# Patient Record
Sex: Female | Born: 1985 | Race: Black or African American | Hispanic: No | Marital: Married | Smoking: Current every day smoker
Health system: Southern US, Community
[De-identification: ages and names within clinical notes are randomized; demographics above are authoritative.]

## PROBLEM LIST (undated history)

## (undated) DIAGNOSIS — E282 Polycystic ovarian syndrome: Secondary | ICD-10-CM

---

## 2013-06-18 ENCOUNTER — Emergency Department (HOSPITAL_BASED_OUTPATIENT_CLINIC_OR_DEPARTMENT_OTHER)
Admission: EM | Admit: 2013-06-18 | Discharge: 2013-06-18 | Disposition: A | Discharge status: Home / Self Care | Payer: 59 | Attending: Emergency Medicine | Admitting: Emergency Medicine

## 2013-06-18 ENCOUNTER — Encounter (HOSPITAL_BASED_OUTPATIENT_CLINIC_OR_DEPARTMENT_OTHER): Payer: Self-pay | Admitting: Emergency Medicine

## 2013-06-18 DIAGNOSIS — F17200 Nicotine dependence, unspecified, uncomplicated: Secondary | ICD-10-CM | POA: Insufficient documentation

## 2013-06-18 DIAGNOSIS — K089 Disorder of teeth and supporting structures, unspecified: Secondary | ICD-10-CM | POA: Insufficient documentation

## 2013-06-18 DIAGNOSIS — K0889 Other specified disorders of teeth and supporting structures: Secondary | ICD-10-CM

## 2013-06-18 MED ORDER — HYDROCODONE-ACETAMINOPHEN 5-325 MG PO TABS: 2.0000 | ORAL_TABLET | ORAL | Status: DC | PRN

## 2013-06-18 MED ORDER — AMOXICILLIN 500 MG PO CAPS: 500.0000 mg | ORAL_CAPSULE | Freq: Three times a day (TID) | ORAL | Status: AC

## 2013-06-18 NOTE — ED Provider Notes (Signed)
Medical screening examination/treatment/procedure(s) were performed by non-physician practitioner and as supervising physician I was immediately available for consultation/collaboration.    Nelia Shiobert L Tarissa Kerin, MD 06/18/13 2029

## 2013-06-18 NOTE — ED Notes (Signed)
C/o right lower gum swelling, onset three days ago.  States 'I think I have an abscess.'

## 2013-06-18 NOTE — Discharge Instructions (Signed)

## 2013-06-18 NOTE — ED Provider Notes (Signed)
CSN: [id number]     Arrival date & time 06/18/13  1835 History   First MD Initiated Contact with Patient 06/18/13 1929     Chief Complaint  Patient presents with  . Dental Pain     (Consider location/radiation/quality/duration/timing/severity/associated sxs/prior Treatment) Patient is a 28 y.o. female presenting with tooth pain. The history is provided by the patient. No language interpreter was used.  Dental Pain Location:  Upper Upper teeth location:  15/LU 2nd molar Quality:  No pain Severity:  Moderate Duration:  3 days Timing:  Constant Progression:  Worsening Chronicity:  New Context: abscess   Relieved by:  Nothing Worsened by:  Nothing tried Ineffective treatments:  None tried   History reviewed. No pertinent past medical history. History reviewed. No pertinent past surgical history. No family history on file. History  Substance Use Topics  . Smoking status: Current Every Day Smoker  . Smokeless tobacco: Not on file  . Alcohol Use: No   OB History   Grav Para Term Preterm Abortions TAB SAB Ect Mult Living                 Review of Systems  HENT: Positive for dental problem.   All other systems reviewed and are negative.     Allergies  Review of patient's allergies indicates no known allergies.  Home Medications   Prior to Admission medications   Not on File   BP 133/92  Pulse 108  Temp(Src) 98.5 F (36.9 C) (Oral)  Resp 20  Ht 5' (1.524 m)  Wt 154 lb (69.854 kg)  BMI 30.08 kg/m2  SpO2 98% Physical Exam  Nursing note and vitals reviewed. Constitutional: She appears well-developed and well-nourished.  HENT:  Head: Normocephalic.  Swollen right lower gum   Eyes: Pupils are equal, round, and reactive to light.  Neck: Normal range of motion.  Cardiovascular: Normal rate.   Pulmonary/Chest: Effort normal.  Abdominal: Soft.    ED Course  Procedures (including critical care time) Labs Review Labs Reviewed - No data to display  Imaging  Review No results found.   EKG Interpretation None      MDM   Final diagnoses:  Toothache    amoxicillian hydrocodone    Elson AreasLeslie K Heatherly Stenner, PA-C 06/18/13 1956  Lonia SkinnerLeslie K EnsleySofia, [place]PA-C 06/18/13 [id number]

## 2014-01-27 ENCOUNTER — Emergency Department (HOSPITAL_BASED_OUTPATIENT_CLINIC_OR_DEPARTMENT_OTHER)
Admission: EM | Admit: 2014-01-27 | Discharge: 2014-01-27 | Disposition: A | Discharge status: Home / Self Care | Payer: 59 | Attending: Emergency Medicine | Admitting: Emergency Medicine

## 2014-01-27 ENCOUNTER — Encounter (HOSPITAL_BASED_OUTPATIENT_CLINIC_OR_DEPARTMENT_OTHER): Payer: Self-pay | Admitting: *Deleted

## 2014-01-27 DIAGNOSIS — N760 Acute vaginitis: Secondary | ICD-10-CM | POA: Diagnosis not present

## 2014-01-27 DIAGNOSIS — Z720 Tobacco use: Secondary | ICD-10-CM | POA: Insufficient documentation

## 2014-01-27 DIAGNOSIS — B9689 Other specified bacterial agents as the cause of diseases classified elsewhere: Secondary | ICD-10-CM

## 2014-01-27 DIAGNOSIS — Z8639 Personal history of other endocrine, nutritional and metabolic disease: Secondary | ICD-10-CM | POA: Insufficient documentation

## 2014-01-27 DIAGNOSIS — Z3202 Encounter for pregnancy test, result negative: Secondary | ICD-10-CM | POA: Diagnosis not present

## 2014-01-27 DIAGNOSIS — B3731 Acute candidiasis of vulva and vagina: Secondary | ICD-10-CM

## 2014-01-27 DIAGNOSIS — B373 Candidiasis of vulva and vagina: Secondary | ICD-10-CM | POA: Insufficient documentation

## 2014-01-27 DIAGNOSIS — R300 Dysuria: Secondary | ICD-10-CM | POA: Diagnosis present

## 2014-01-27 HISTORY — DX: Polycystic ovarian syndrome: E28.2

## 2014-01-27 LAB — WET PREP, GENITAL: Trich, Wet Prep: NONE SEEN

## 2014-01-27 LAB — URINE MICROSCOPIC-ADD ON

## 2014-01-27 LAB — URINALYSIS, ROUTINE W REFLEX MICROSCOPIC
BILIRUBIN URINE: NEGATIVE
Glucose, UA: NEGATIVE mg/dL
Ketones, ur: NEGATIVE mg/dL
NITRITE: NEGATIVE
PROTEIN: NEGATIVE mg/dL
SPECIFIC GRAVITY, URINE: 1.025 (ref 1.005–1.030)
UROBILINOGEN UA: 1 mg/dL (ref 0.0–1.0)
pH: 5 (ref 5.0–8.0)

## 2014-01-27 LAB — PREGNANCY, URINE: Preg Test, Ur: NEGATIVE

## 2014-01-27 MED ORDER — METRONIDAZOLE 500 MG PO TABS: 500.0000 mg | ORAL_TABLET | Freq: Two times a day (BID) | ORAL | Status: DC

## 2014-01-27 MED ORDER — FLUCONAZOLE 150 MG PO TABS: 150.0000 mg | ORAL_TABLET | Freq: Once | ORAL | Status: DC

## 2014-01-27 NOTE — ED Provider Notes (Signed)
CSN: [id number]     Arrival date & time 01/27/14  1042 History   First MD Initiated Contact with Patient 01/27/14 1206     Chief Complaint  Patient presents with  . Dysuria     (Consider location/radiation/quality/duration/timing/severity/associated sxs/prior Treatment) HPI Comments: Patient is a 28 year old female with a past medical history of PCOS who presents with frequent urination, genital itching and vaginal discharge for 2 weeks. The vaginal discharge is white and occurs daily. Symptoms started gradually and remained constant since the onset. No abdominal pain or fevers. No aggravating/alleviating factors. No other associated symptoms .   Past Medical History  Diagnosis Date  . PCOS (polycystic ovarian syndrome)    History reviewed. No pertinent past surgical history. No family history on file. History  Substance Use Topics  . Smoking status: Current Every Day Smoker  . Smokeless tobacco: Not on file  . Alcohol Use: No   OB History    No data available     Review of Systems  Constitutional: Negative for fever, chills and fatigue.  HENT: Negative for trouble swallowing.   Eyes: Negative for visual disturbance.  Respiratory: Negative for shortness of breath.   Cardiovascular: Negative for chest pain and palpitations.  Gastrointestinal: Negative for nausea, vomiting, abdominal pain and diarrhea.  Genitourinary: Positive for dysuria, frequency and vaginal discharge. Negative for difficulty urinating.  Musculoskeletal: Negative for arthralgias and neck pain.  Skin: Negative for color change.  Neurological: Negative for dizziness and weakness.  Psychiatric/Behavioral: Negative for dysphoric mood.      Allergies  Review of patient's allergies indicates no known allergies.  Home Medications   Prior to Admission medications   Medication Sig Start Date End Date Taking? Authorizing Provider  HYDROcodone-acetaminophen (NORCO/VICODIN) 5-325 MG per tablet Take 2 tablets  by mouth every 4 (four) hours as needed. 06/18/13   Lonia SkinnerLeslie K Sofia, PA-C   BP 122/90 mmHg  Pulse 81  Temp(Src) 98 F (36.7 C) (Oral)  Resp 16  Ht 5' (1.524 m)  Wt 150 lb (68.04 kg)  BMI 29.30 kg/m2  SpO2 97%  LMP 08/19/2013 Physical Exam  Constitutional: She is oriented to person, place, and time. She appears well-developed and well-nourished. No distress.  HENT:  Head: Normocephalic and atraumatic.  Eyes: Conjunctivae and EOM are normal.  Neck: Normal range of motion.  Cardiovascular: Normal rate and regular rhythm.  Exam reveals no gallop and no friction rub.   No murmur heard. Pulmonary/Chest: Effort normal and breath sounds normal. She has no wheezes. She has no rales. She exhibits no tenderness.  Abdominal: Soft. She exhibits no distension. There is no tenderness. There is no rebound.  Genitourinary: Vagina normal.  Copious, milky, white, frothy vaginal discharge. No CMT. Cervical os closed. No tenderness to palpation on bimanual exam.   Musculoskeletal: Normal range of motion.  Neurological: She is alert and oriented to person, place, and time. Coordination normal.  Speech is goal-oriented. Moves limbs without ataxia.   Skin: Skin is warm and dry.  Psychiatric: She has a normal mood and affect. Her behavior is normal.  Nursing note and vitals reviewed.   ED Course  Procedures (including critical care time) Labs Review Labs Reviewed  WET PREP, GENITAL - Abnormal; Notable for the following:    Yeast Wet Prep HPF POC MODERATE (*)    Clue Cells Wet Prep HPF POC TOO NUMEROUS TO COUNT (*)    WBC, Wet Prep HPF POC TOO NUMEROUS TO COUNT (*)    All  other components within normal limits  URINALYSIS, ROUTINE W REFLEX MICROSCOPIC - Abnormal; Notable for the following:    APPearance CLOUDY (*)    Hgb urine dipstick TRACE (*)    Leukocytes, UA SMALL (*)    All other components within normal limits  URINE MICROSCOPIC-ADD ON - Abnormal; Notable for the following:    Squamous  Epithelial / LPF MANY (*)    Bacteria, UA MANY (*)    All other components within normal limits  GC/CHLAMYDIA PROBE AMP  PREGNANCY, URINE    Imaging Review No results found.   EKG Interpretation None      MDM   Final diagnoses:  Vaginal yeast infection  BV (bacterial vaginosis)    12:26 PM Patient's urinalysis appears contaminated. Wet prep pending. Vitals stable and patient afebrile.   1:48 PM Patient will be treated for vaginal yeast infection and BV. Vitals stable and patient afebrile.    Emilia BeckKaitlyn Tanessa Tidd, PA-C 01/27/14 1349  Doug SouSam Jacubowitz, MD 01/27/14 1620

## 2014-01-27 NOTE — ED Notes (Signed)
Pt c/o freq urination, genital itching and vag discharge x2 weeks.

## 2014-01-27 NOTE — Discharge Instructions (Signed)
Take diflucan as directed. Take Flagyl as directed until gone. Refer to attached documents for more information. You do not have a UTI based on today's results.

## 2014-01-28 LAB — GC/CHLAMYDIA PROBE AMP
CT PROBE, AMP APTIMA: NEGATIVE
GC Probe RNA: POSITIVE — AB

## 2014-01-29 ENCOUNTER — Telehealth: Payer: Self-pay | Admitting: Emergency Medicine

## 2014-01-29 NOTE — Telephone Encounter (Signed)
+  Gonorrhea. Chart sent to EDP office for review. [facility] attached. °

## 2014-02-13 ENCOUNTER — Encounter (HOSPITAL_BASED_OUTPATIENT_CLINIC_OR_DEPARTMENT_OTHER): Payer: Self-pay

## 2014-02-13 ENCOUNTER — Emergency Department (HOSPITAL_BASED_OUTPATIENT_CLINIC_OR_DEPARTMENT_OTHER)
Admission: EM | Admit: 2014-02-13 | Discharge: 2014-02-13 | Disposition: A | Discharge status: Home / Self Care | Payer: 59 | Attending: Emergency Medicine | Admitting: Emergency Medicine

## 2014-02-13 DIAGNOSIS — Z3202 Encounter for pregnancy test, result negative: Secondary | ICD-10-CM | POA: Insufficient documentation

## 2014-02-13 DIAGNOSIS — Z720 Tobacco use: Secondary | ICD-10-CM | POA: Diagnosis not present

## 2014-02-13 DIAGNOSIS — Z8639 Personal history of other endocrine, nutritional and metabolic disease: Secondary | ICD-10-CM | POA: Insufficient documentation

## 2014-02-13 DIAGNOSIS — Z792 Long term (current) use of antibiotics: Secondary | ICD-10-CM | POA: Diagnosis not present

## 2014-02-13 DIAGNOSIS — A549 Gonococcal infection, unspecified: Secondary | ICD-10-CM | POA: Insufficient documentation

## 2014-02-13 DIAGNOSIS — N390 Urinary tract infection, site not specified: Secondary | ICD-10-CM | POA: Diagnosis not present

## 2014-02-13 DIAGNOSIS — N898 Other specified noninflammatory disorders of vagina: Secondary | ICD-10-CM | POA: Diagnosis not present

## 2014-02-13 DIAGNOSIS — Z202 Contact with and (suspected) exposure to infections with a predominantly sexual mode of transmission: Secondary | ICD-10-CM | POA: Diagnosis present

## 2014-02-13 LAB — URINE MICROSCOPIC-ADD ON

## 2014-02-13 LAB — URINALYSIS, ROUTINE W REFLEX MICROSCOPIC
Bilirubin Urine: NEGATIVE
Glucose, UA: NEGATIVE mg/dL
Ketones, ur: NEGATIVE mg/dL
Nitrite: NEGATIVE
PH: 5 (ref 5.0–8.0)
Protein, ur: NEGATIVE mg/dL
Specific Gravity, Urine: 1.021 (ref 1.005–1.030)
UROBILINOGEN UA: 0.2 mg/dL (ref 0.0–1.0)

## 2014-02-13 LAB — PREGNANCY, URINE: Preg Test, Ur: NEGATIVE

## 2014-02-13 LAB — WET PREP, GENITAL
Clue Cells Wet Prep HPF POC: NONE SEEN
Trich, Wet Prep: NONE SEEN
Yeast Wet Prep HPF POC: NONE SEEN

## 2014-02-13 MED ORDER — METRONIDAZOLE 500 MG PO TABS
2000.0000 mg | ORAL_TABLET | ORAL | Status: AC
  Administered 2014-02-13: 2000 mg via ORAL
  Filled 2014-02-13: qty 4

## 2014-02-13 MED ORDER — CEFTRIAXONE SODIUM 250 MG IJ SOLR
250.0000 mg | Freq: Once | INTRAMUSCULAR | Status: AC
  Administered 2014-02-13: 250 mg via INTRAMUSCULAR
  Filled 2014-02-13: qty 250

## 2014-02-13 MED ORDER — AZITHROMYCIN 250 MG PO TABS
1000.0000 mg | ORAL_TABLET | Freq: Once | ORAL | Status: AC
  Administered 2014-02-13: 1000 mg via ORAL
  Filled 2014-02-13: qty 4

## 2014-02-13 MED ORDER — LIDOCAINE HCL (PF) 1 % IJ SOLN
INTRAMUSCULAR | Status: AC
  Administered 2014-02-13: 5 mL
  Filled 2014-02-13: qty 5

## 2014-02-13 MED ORDER — FLUCONAZOLE 150 MG PO TABS: 150.0000 mg | ORAL_TABLET | Freq: Once | ORAL | Status: DC

## 2014-02-13 MED ORDER — CIPROFLOXACIN HCL 250 MG PO TABS: 250.0000 mg | ORAL_TABLET | Freq: Two times a day (BID) | ORAL | Status: AC

## 2014-02-13 MED ORDER — ONDANSETRON 4 MG PO TBDP
4.0000 mg | ORAL_TABLET | Freq: Once | ORAL | Status: AC
  Administered 2014-02-13: 4 mg via ORAL
  Filled 2014-02-13: qty 1

## 2014-02-13 NOTE — ED Provider Notes (Signed)
CSN: [id number]     Arrival date & time 02/13/14  [id number] History   First MD Initiated Contact with Patient 02/13/14 [id number]     Chief Complaint  Patient presents with  . Exposure to STD     (Consider location/radiation/quality/duration/timing/severity/associated sxs/prior Treatment) Patient is a 28 y.o. female presenting with STD exposure. The history is provided by the patient.  Exposure to STD This is a new problem. The current episode started more than 1 week ago. Episode frequency: once. The problem has been resolved. Pertinent negatives include no chest pain, no abdominal pain, no headaches and no shortness of breath. Nothing aggravates the symptoms. Nothing relieves the symptoms. Treatments tried: flagyl, fluconazole. The treatment provided no relief.    Past Medical History  Diagnosis Date  . PCOS (polycystic ovarian syndrome)    History reviewed. No pertinent past surgical history. No family history on file. History  Substance Use Topics  . Smoking status: Current Every Day Smoker  . Smokeless tobacco: Not on file  . Alcohol Use: No   OB History    No data available     Review of Systems  Constitutional: Negative for fever and fatigue.  HENT: Negative for congestion and drooling.   Eyes: Negative for pain.  Respiratory: Negative for cough and shortness of breath.   Cardiovascular: Negative for chest pain.  Gastrointestinal: Negative for nausea, vomiting, abdominal pain and diarrhea.  Genitourinary: Positive for dysuria and vaginal discharge. Negative for hematuria.  Musculoskeletal: Negative for back pain, gait problem and neck pain.  Skin: Negative for color change.  Neurological: Negative for dizziness and headaches.  Hematological: Negative for adenopathy.  Psychiatric/Behavioral: Negative for behavioral problems.  All other systems reviewed and are negative.     Allergies  Review of patient's allergies indicates no known allergies.  Home Medications    Prior to Admission medications   Medication Sig Start Date End Date Taking? Authorizing Provider  fluconazole (DIFLUCAN) 150 MG tablet Take 1 tablet (150 mg total) by mouth once. 01/27/14   Emilia BeckKaitlyn Szekalski, PA-C  HYDROcodone-acetaminophen (NORCO/VICODIN) 5-325 MG per tablet Take 2 tablets by mouth every 4 (four) hours as needed. 06/18/13   Elson AreasLeslie K Sofia, PA-C  metroNIDAZOLE (FLAGYL) 500 MG tablet Take 1 tablet (500 mg total) by mouth 2 (two) times daily. 01/27/14   Kaitlyn Szekalski, PA-C   BP 121/83 mmHg  Pulse 71  Temp(Src) 99.3 F (37.4 C) (Oral)  Resp 16  Ht 5' (1.524 m)  Wt 154 lb (69.854 kg)  BMI 30.08 kg/m2  SpO2 100%  LMP 08/19/2013 Physical Exam  Constitutional: She is oriented to person, place, and time. She appears well-developed and well-nourished.  HENT:  Head: Normocephalic and atraumatic.  Mouth/Throat: Oropharynx is clear and moist. No oropharyngeal exudate.  Eyes: Conjunctivae and EOM are normal. Pupils are equal, round, and reactive to light.  Neck: Normal range of motion. Neck supple.  Cardiovascular: Normal rate, regular rhythm, normal heart sounds and intact distal pulses.  Exam reveals no gallop and no friction rub.   No murmur heard. Pulmonary/Chest: Effort normal and breath sounds normal. No respiratory distress. She has no wheezes.  Abdominal: Soft. Bowel sounds are normal. There is no tenderness. There is no rebound and no guarding.  Genitourinary:  Normal-appearing external vagina. Os closed. Mild to moderate amount of milky white fluid in the posterior fornix. No cervical motion tenderness. No tenderness during the bimanual exam.  Musculoskeletal: Normal range of motion. She exhibits no edema or tenderness.  Neurological: She  is alert and oriented to person, place, and time.  Skin: Skin is warm and dry.  Psychiatric: She has a normal mood and affect. Her behavior is normal.  Nursing note and vitals reviewed.   ED Course  Procedures (including  critical care time) Labs Review Labs Reviewed  WET PREP, GENITAL - Abnormal; Notable for the following:    WBC, Wet Prep HPF POC FEW (*)    All other components within normal limits  URINALYSIS, ROUTINE W REFLEX MICROSCOPIC - Abnormal; Notable for the following:    APPearance CLOUDY (*)    Hgb urine dipstick TRACE (*)    Leukocytes, UA MODERATE (*)    All other components within normal limits  GC/CHLAMYDIA PROBE AMP  URINE CULTURE  PREGNANCY, URINE  URINE MICROSCOPIC-ADD ON    Imaging Review No results found.   EKG Interpretation None      MDM   Final diagnoses:  Gonorrhea  Vaginal discharge  UTI (lower urinary tract infection)    9:02 AM 28 y.o. female who presents with ongoing vaginal discharge and dysuria. She states that she was seen 2 weeks ago and had a pelvic exam and was treated for bacterial vaginosis and a yeast infection.it appears that she had a positive gonorrhea test. She states that she was not treated for this. She denies any fevers and has occasional pelvic pain but none currently. Afebrile and vital signs unremarkable here. Will treat empirically with antibiotics and repeat pelvic. Pt has hx of chlamydia, has had sex w/ the same partner since last visit. Last LMP was July, pt w/ hx of PCOS and irreg periods.   10:20 AM: UA suspicious for UTI. Will cover w/ cipro. Will also give fluconazole in case pt gets a yeast infection. Pt educated that her partner must be treated prior to sex again.  I have discussed the diagnosis/risks/treatment options with the patient and believe the pt to be eligible for discharge home to follow-up with her pcp as needed. We also discussed returning to the ED immediately if new or worsening sx occur. We discussed the sx which are most concerning (e.g., ongoing vag d/c or dysuria, pelvic pain, fever) that necessitate immediate return. Medications administered to the patient during their visit and any new prescriptions provided to the  patient are listed below.  Medications given during this visit Medications  cefTRIAXone (ROCEPHIN) injection 250 mg (250 mg Intramuscular Given 02/13/14 0908)  metroNIDAZOLE (FLAGYL) tablet 2,000 mg (2,000 mg Oral Given 02/13/14 0908)  azithromycin (ZITHROMAX) tablet 1,000 mg (1,000 mg Oral Given 02/13/14 0908)  lidocaine (PF) (XYLOCAINE) 1 % injection (5 mLs  Given 02/13/14 0908)  ondansetron (ZOFRAN-ODT) disintegrating tablet 4 mg (4 mg Oral Given 02/13/14 1019)    New Prescriptions   CIPROFLOXACIN (CIPRO) 250 MG TABLET    Take 1 tablet (250 mg total) by mouth 2 (two) times daily.   FLUCONAZOLE (DIFLUCAN) 150 MG TABLET    Take 1 tablet (150 mg total) by mouth once. Use as needed for symptoms of yeast infection.     Purvis SheffieldForrest Terryl Molinelli, MD 02/13/14 1024

## 2014-02-13 NOTE — ED Notes (Signed)
Reports white vaginal discharge with dysuria. Also reports that partner told her he may have urethritis.

## 2014-02-14 LAB — GC/CHLAMYDIA PROBE AMP
CT Probe RNA: NEGATIVE
GC Probe RNA: POSITIVE — AB

## 2014-02-14 LAB — URINE CULTURE: Colony Count: 30000

## 2014-02-15 ENCOUNTER — Telehealth (HOSPITAL_BASED_OUTPATIENT_CLINIC_OR_DEPARTMENT_OTHER): Payer: Self-pay | Admitting: Emergency Medicine

## 2014-02-17 ENCOUNTER — Telehealth (HOSPITAL_COMMUNITY): Payer: Self-pay | Admitting: *Deleted

## 2016-03-11 ENCOUNTER — Encounter (HOSPITAL_BASED_OUTPATIENT_CLINIC_OR_DEPARTMENT_OTHER): Payer: Self-pay | Admitting: Emergency Medicine

## 2016-03-11 ENCOUNTER — Emergency Department (HOSPITAL_BASED_OUTPATIENT_CLINIC_OR_DEPARTMENT_OTHER)
Admission: EM | Admit: 2016-03-11 | Discharge: 2016-03-11 | Disposition: A | Discharge status: Home / Self Care | Payer: 59 | Attending: Emergency Medicine | Admitting: Emergency Medicine

## 2016-03-11 DIAGNOSIS — F17200 Nicotine dependence, unspecified, uncomplicated: Secondary | ICD-10-CM | POA: Diagnosis not present

## 2016-03-11 DIAGNOSIS — N939 Abnormal uterine and vaginal bleeding, unspecified: Secondary | ICD-10-CM

## 2016-03-11 LAB — CBC
HCT: 41 % (ref 36.0–46.0)
Hemoglobin: 13.8 g/dL (ref 12.0–15.0)
MCH: 29.8 pg (ref 26.0–34.0)
MCHC: 33.7 g/dL (ref 30.0–36.0)
MCV: 88.6 fL (ref 78.0–100.0)
PLATELETS: 372 10*3/uL (ref 150–400)
RBC: 4.63 MIL/uL (ref 3.87–5.11)
RDW: 12.6 % (ref 11.5–15.5)
WBC: 7.5 10*3/uL (ref 4.0–10.5)

## 2016-03-11 NOTE — ED Provider Notes (Signed)
[facility]   CSN: [id number] Arrival date & time: 03/11/16  1024     History   Chief Complaint Chief Complaint  Patient presents with  . Menorrhagia    HPI Sydney Harmon is a 31 y.o. female.  The history is provided by the patient and medical records. No language interpreter was used.    Sydney Harmon is a 31 y.o. female  with a PMH of PCOS who presents to the Emergency Department complaining of prolonged vaginal bleeding. Patient states that she had a menstrual cycle at the end of December which lasted approximately 3 weeks. She saw her OB/GYN at that time he started patient on a new medication (she believes this is progestin, but not positive). Symptoms did not improve, therefore she called the clinic and they informed her to take 4 pills instead of 2 daily. Bleeding stopped for 4 days, but then returned last week. She has had heavy bleeding for the last 6 days. She is very concerned today because she started passing clots, greater than 5 times per day. He shouldn't notes that clots were between a quarter size or golfball size. She has been going through a pad every hour until yesterday when she started going through a pad every 2-3 hours. This is happened one time before, several years ago when on OCPs. No lightheadedness, dizziness, fatigue or syncopal episodes. No abdominal pain, nausea or vomiting.    Past Medical History:  Diagnosis Date  . PCOS (polycystic ovarian syndrome)     There are no active problems to display for this patient.   No past surgical history on file.  OB History    No data available       Home Medications    Prior to Admission medications   Not on File    Family History No family history on file.  Social History Social History  Substance Use Topics  . Smoking status: Current Every Day Smoker  . Smokeless tobacco: Never Used  . Alcohol use No     Allergies   Patient has no known  allergies.   Review of Systems Review of Systems  Constitutional: Negative for fatigue.  HENT: Negative for congestion.   Eyes: Negative for visual disturbance.  Respiratory: Negative for shortness of breath.   Cardiovascular: Negative for chest pain.  Gastrointestinal: Negative for abdominal pain, constipation, diarrhea, nausea and vomiting.  Genitourinary: Positive for vaginal bleeding. Negative for dysuria.  Musculoskeletal: Negative for back pain.  Skin: Negative for rash.  Neurological: Negative for weakness and light-headedness.     Physical Exam Updated Vital Signs BP 134/97 (BP Location: Left Arm)   Pulse 76   Temp 98.6 F (37 C) (Oral)   Resp 16   Ht 5' (1.524 m)   Wt 74.8 kg   LMP 03/04/2016   SpO2 100%   BMI 32.22 kg/m   Physical Exam  Constitutional: She is oriented to person, place, and time. She appears well-developed and well-nourished. No distress.  HENT:  Head: Normocephalic and atraumatic.  Cardiovascular: Normal rate, regular rhythm and normal heart sounds.   No murmur heard. Pulmonary/Chest: Effort normal and breath sounds normal. No respiratory distress.  Abdominal: Soft. She exhibits no distension. There is no tenderness.  Genitourinary:  Genitourinary Comments: Deferred.  Neurological: She is alert and oriented to person, place, and time.  Skin: Skin is warm and dry. Capillary refill takes less than 2 seconds.  Psychiatric: She has a normal mood and affect.  Nursing  note and vitals reviewed.    ED Treatments / Results  Labs (all labs ordered are listed, but only abnormal results are displayed) Labs Reviewed  CBC    EKG  EKG Interpretation None       Radiology No results found.  Procedures Procedures (including critical care time)  Medications Ordered in ED Medications - No data to display   Initial Impression / Assessment and Plan / ED Course  I have reviewed the triage vital signs and the nursing notes.  Pertinent  labs & imaging results that were available during my care of the patient were reviewed by me and considered in my medical decision making (see chart for details).    Abrar Maggard is a 31 y.o. female who presents to ED for persistent vaginal bleeding. Patient has seen OB/GYN for this and then placed on medication which she believes is progestin. CBC with normal H&H and patient experiencing no symptoms of anemia. The patient is followed by OB/GYN who is managing this problem with medication, therefore I do not feel that it is in the patient's best interest to change medication regimen at this time. I discussed this with patient and family at bedside. I called her OB/GYN and scheduled her an appointment for tomorrow afternoon at 1:15. She is aware of this appointment and agrees to follow-up. Given that she has such close follow-up with OB/GYN, will defer pelvic at this time. Patient discharged in satisfactory condition and all questions answered.  Final Clinical Impressions(s) / ED Diagnoses   Final diagnoses:  None    New Prescriptions New Prescriptions   No medications on file     [facility] Fredrick Geoghegan, PA-C 03/11/16 1445    Alvira Monday, MD 03/13/16 [phone]

## 2016-03-11 NOTE — ED Notes (Signed)
ED Provider at bedside. 

## 2016-03-11 NOTE — ED Notes (Signed)
States she has always had irregular periods with increasing frequency since November. Her OBGYN has started her on provera on Jan 5

## 2016-03-11 NOTE — Discharge Instructions (Signed)
You have an appointment with the OB/GYN, Dr. Rito Ehrlich'Keefe, tomorrow at 1:15 PM.   [facility]   [address]   [address]   [place] [id number]   [phone]      Please keep this scheduled appointment. Return to ER for new or worsening symptoms, any additional concerns.

## 2016-03-11 NOTE — ED Triage Notes (Signed)
Pt having abnormal periods since 12 but this months pt is having severe prolonged bleeding.  Pt on hormone treatment but has bleed for over one week with heavy bleeding, clots and generalized malaise.

## 2020-12-19 ENCOUNTER — Emergency Department (HOSPITAL_BASED_OUTPATIENT_CLINIC_OR_DEPARTMENT_OTHER)
Admission: EM | Admit: 2020-12-19 | Discharge: 2020-12-19 | Disposition: A | Discharge status: Home / Self Care | Payer: BC Managed Care – PPO | Attending: Emergency Medicine | Admitting: Emergency Medicine

## 2020-12-19 ENCOUNTER — Emergency Department (HOSPITAL_BASED_OUTPATIENT_CLINIC_OR_DEPARTMENT_OTHER): Payer: BC Managed Care – PPO

## 2020-12-19 ENCOUNTER — Other Ambulatory Visit: Payer: Self-pay

## 2020-12-19 ENCOUNTER — Encounter (HOSPITAL_BASED_OUTPATIENT_CLINIC_OR_DEPARTMENT_OTHER): Payer: Self-pay | Admitting: Radiology

## 2020-12-19 DIAGNOSIS — R1031 Right lower quadrant pain: Secondary | ICD-10-CM | POA: Diagnosis present

## 2020-12-19 DIAGNOSIS — N8301 Follicular cyst of right ovary: Secondary | ICD-10-CM | POA: Insufficient documentation

## 2020-12-19 DIAGNOSIS — R110 Nausea: Secondary | ICD-10-CM | POA: Insufficient documentation

## 2020-12-19 DIAGNOSIS — N83201 Unspecified ovarian cyst, right side: Secondary | ICD-10-CM

## 2020-12-19 DIAGNOSIS — K5900 Constipation, unspecified: Secondary | ICD-10-CM | POA: Insufficient documentation

## 2020-12-19 DIAGNOSIS — F17200 Nicotine dependence, unspecified, uncomplicated: Secondary | ICD-10-CM | POA: Insufficient documentation

## 2020-12-19 LAB — CBC WITH DIFFERENTIAL/PLATELET
Abs Immature Granulocytes: 0.02 10*3/uL (ref 0.00–0.07)
Basophils Absolute: 0 10*3/uL (ref 0.0–0.1)
Basophils Relative: 0 %
Eosinophils Absolute: 0.2 10*3/uL (ref 0.0–0.5)
Eosinophils Relative: 3 %
HCT: 45.8 % (ref 36.0–46.0)
Hemoglobin: 15 g/dL (ref 12.0–15.0)
Immature Granulocytes: 0 %
Lymphocytes Relative: 24 %
Lymphs Abs: 1.7 10*3/uL (ref 0.7–4.0)
MCH: 29.4 pg (ref 26.0–34.0)
MCHC: 32.8 g/dL (ref 30.0–36.0)
MCV: 89.6 fL (ref 80.0–100.0)
Monocytes Absolute: 0.4 10*3/uL (ref 0.1–1.0)
Monocytes Relative: 6 %
Neutro Abs: 4.7 10*3/uL (ref 1.7–7.7)
Neutrophils Relative %: 67 %
Platelets: 269 10*3/uL (ref 150–400)
RBC: 5.11 MIL/uL (ref 3.87–5.11)
RDW: 12.3 % (ref 11.5–15.5)
WBC: 7.1 10*3/uL (ref 4.0–10.5)
nRBC: 0 % (ref 0.0–0.2)

## 2020-12-19 LAB — LACTIC ACID, PLASMA: Lactic Acid, Venous: 0.9 mmol/L (ref 0.5–1.9)

## 2020-12-19 LAB — URINALYSIS, MICROSCOPIC (REFLEX)

## 2020-12-19 LAB — URINALYSIS, ROUTINE W REFLEX MICROSCOPIC
Bilirubin Urine: NEGATIVE
Glucose, UA: NEGATIVE mg/dL
Ketones, ur: NEGATIVE mg/dL
Leukocytes,Ua: NEGATIVE
Nitrite: NEGATIVE
Protein, ur: NEGATIVE mg/dL
Specific Gravity, Urine: 1.01 (ref 1.005–1.030)
pH: 5.5 (ref 5.0–8.0)

## 2020-12-19 LAB — PREGNANCY, URINE: Preg Test, Ur: NEGATIVE

## 2020-12-19 LAB — COMPREHENSIVE METABOLIC PANEL
ALT: 39 U/L (ref 0–44)
AST: 28 U/L (ref 15–41)
Albumin: 4.8 g/dL (ref 3.5–5.0)
Alkaline Phosphatase: 69 U/L (ref 38–126)
Anion gap: 8 (ref 5–15)
BUN: 7 mg/dL (ref 6–20)
CO2: 25 mmol/L (ref 22–32)
Calcium: 9.2 mg/dL (ref 8.9–10.3)
Chloride: 101 mmol/L (ref 98–111)
Creatinine, Ser: 0.69 mg/dL (ref 0.44–1.00)
GFR, Estimated: >60 mL/min (ref >60)
Glucose, Bld: 97 mg/dL (ref 70–99)
Potassium: 4.3 mmol/L (ref 3.5–5.1)
Sodium: 134 mmol/L — ABNORMAL LOW (ref 135–145)
Total Bilirubin: 1 mg/dL (ref 0.3–1.2)
Total Protein: 8.1 g/dL (ref 6.5–8.1)

## 2020-12-19 LAB — LIPASE, BLOOD: Lipase: 26 U/L (ref 11–51)

## 2020-12-19 MED ORDER — ONDANSETRON HCL 4 MG PO TABS: 4.0000 mg | ORAL_TABLET | Freq: Three times a day (TID) | ORAL | 0 refills | Status: AC | PRN

## 2020-12-19 MED ORDER — MORPHINE SULFATE (PF) 4 MG/ML IV SOLN
4.0000 mg | Freq: Once | INTRAVENOUS | Status: AC
  Administered 2020-12-19: 4 mg via INTRAVENOUS
  Filled 2020-12-19: qty 1

## 2020-12-19 MED ORDER — OXYCODONE-ACETAMINOPHEN 5-325 MG PO TABS: 1.0000 | ORAL_TABLET | ORAL | 0 refills | Status: AC | PRN

## 2020-12-19 MED ORDER — ONDANSETRON HCL 4 MG/2ML IJ SOLN
4.0000 mg | Freq: Once | INTRAMUSCULAR | Status: AC
  Administered 2020-12-19: 4 mg via INTRAVENOUS
  Filled 2020-12-19: qty 2

## 2020-12-19 MED ORDER — IOHEXOL 300 MG/ML  SOLN
100.0000 mL | Freq: Once | INTRAMUSCULAR | Status: AC | PRN
  Administered 2020-12-19: 100 mL via INTRAVENOUS

## 2020-12-19 NOTE — ED Notes (Signed)
Pt states she wants to leave, explained to her all results were in and ED provider was made aware

## 2020-12-19 NOTE — Discharge Instructions (Signed)
Your history, exam, work-up today are consistent with a hemorrhagic right ovarian cyst causing your discomfort.  There was no evidence of appendicitis or diverticulitis on the CT scan and the ultrasound showed the likely hemorrhagic cyst causing your symptoms.  Other work-up was overall reassuring and given your slight improvement in pain, we feel you are safe for discharge home.  Please use the pain medicine nausea symptomatic hydration and help with the discomfort and please follow-up with your OB/GYN for further management.  If any symptoms change or worsen acutely, please return to the nearest emergency department.

## 2020-12-19 NOTE — ED Notes (Signed)
ED Provider at bedside. 

## 2020-12-19 NOTE — ED Provider Notes (Signed)
[facility] EMERGENCY DEPARTMENT Provider Note   CSN: [id number] Arrival date & time: 12/19/20  0630     History Chief Complaint  Patient presents with   Abdominal Pain    Sydney Harmon is a 35 y.o. female.  The history is provided by the patient and medical records. No language interpreter was used.  Abdominal Pain Pain location:  RLQ Pain quality: aching and sharp   Pain radiates to:  Does not radiate Pain severity:  Severe Onset quality:  Gradual Duration:  3 days Timing:  Constant Progression:  Worsening Chronicity:  New Relieved by:  Nothing Worsened by:  Nothing Ineffective treatments:  None tried Associated symptoms: constipation and nausea   Associated symptoms: no chest pain, no chills, no cough, no diarrhea, no dysuria, no fatigue, no fever, no shortness of breath and no vomiting   Risk factors: not pregnant       Past Medical History:  Diagnosis Date   PCOS (polycystic ovarian syndrome)     There are no problems to display for this patient.   No past surgical history on file.   OB History   No obstetric history on file.     No family history on file.  Social History   Tobacco Use   Smoking status: Every Day   Smokeless tobacco: Never  Substance Use Topics   Alcohol use: No   Drug use: No    Home Medications Prior to Admission medications   Not on File    Allergies    Patient has no known allergies.  Review of Systems   Review of Systems  Constitutional:  Negative for chills, fatigue and fever.  HENT:  Negative for congestion.   Respiratory:  Negative for cough, chest tightness, shortness of breath and wheezing.   Cardiovascular:  Negative for chest pain.  Gastrointestinal:  Positive for abdominal pain, constipation and nausea. Negative for abdominal distention, diarrhea and vomiting.  Genitourinary:  Negative for dysuria, flank pain and frequency.  Musculoskeletal:  Negative for back pain, neck pain and neck  stiffness.  Skin:  Negative for wound.  Neurological:  Negative for light-headedness and headaches.  Psychiatric/Behavioral:  Negative for agitation.    Physical Exam Updated Vital Signs BP (!) 144/109   Pulse 80   Temp 98.1 F (36.7 C) (Oral)   Resp 18   Ht 5' (1.524 m)   Wt 75.3 kg   LMP 11/20/2020 (Approximate) Comment: hx of PCOs  SpO2 100%   BMI 32.42 kg/m   Physical Exam Vitals and nursing note reviewed.  Constitutional:      General: She is not in acute distress.    Appearance: She is well-developed. She is not ill-appearing, toxic-appearing or diaphoretic.  HENT:     Head: Normocephalic and atraumatic.     Mouth/Throat:     Mouth: Mucous membranes are moist.  Eyes:     Conjunctiva/sclera: Conjunctivae normal.  Cardiovascular:     Rate and Rhythm: Normal rate and regular rhythm.     Heart sounds: No murmur heard. Pulmonary:     Effort: Pulmonary effort is normal. No respiratory distress.     Breath sounds: Normal breath sounds. No wheezing, rhonchi or rales.  Chest:     Chest wall: No tenderness.  Abdominal:     General: Bowel sounds are normal.     Palpations: Abdomen is soft.     Tenderness: There is abdominal tenderness in the right lower quadrant. There is no right CVA tenderness, left CVA  tenderness or guarding.  Musculoskeletal:     Cervical back: Neck supple.  Skin:    General: Skin is warm and dry.     Capillary Refill: Capillary refill takes less than 2 seconds.  Neurological:     Mental Status: She is alert.  Psychiatric:        Mood and Affect: Mood normal.    ED Results / Procedures / Treatments   Labs (all labs ordered are listed, but only abnormal results are displayed) Labs Reviewed  URINALYSIS, ROUTINE W REFLEX MICROSCOPIC - Abnormal; Notable for the following components:      Result Value   Color, Urine STRAW (*)    Hgb urine dipstick TRACE (*)    All other components within normal limits  URINALYSIS, MICROSCOPIC (REFLEX) -  Abnormal; Notable for the following components:   Bacteria, UA RARE (*)    All other components within normal limits  COMPREHENSIVE METABOLIC PANEL - Abnormal; Notable for the following components:   Sodium 134 (*)    All other components within normal limits  PREGNANCY, URINE  CBC WITH DIFFERENTIAL/PLATELET  LIPASE, BLOOD  LACTIC ACID, PLASMA    EKG None  Radiology CT ABDOMEN PELVIS W CONTRAST  Result Date: 12/19/2020 CLINICAL DATA:  Right lower quadrant abdominal pain. Clinical concern for appendicitis. EXAM: CT ABDOMEN AND PELVIS WITH CONTRAST TECHNIQUE: Multidetector CT imaging of the abdomen and pelvis was performed using the standard protocol following bolus administration of intravenous contrast. CONTRAST:  OMNIPAQUE IOHEXOL 300 MG/ML  SOLN COMPARISON:  None. FINDINGS: Lower chest: Clear lung bases.  Heart normal in size. Hepatobiliary: Decreased attenuation of the liver consistent with fatty infiltration. Liver normal in size. No masses. Normal gallbladder. No bile duct dilation. Pancreas: Unremarkable. No pancreatic ductal dilatation or surrounding inflammatory changes. Spleen: Normal in size without focal abnormality. Adrenals/Urinary Tract: Adrenal glands are unremarkable. Kidneys are normal, without renal calculi, focal lesion, or hydronephrosis. Bladder is unremarkable. Stomach/Bowel: Normal appendix visualized. Normal stomach. Small bowel and colon are normal in caliber. No wall thickening or inflammation. Vascular/Lymphatic: No significant vascular findings are present. No enlarged abdominal or pelvic lymph nodes. Reproductive: Normal uterus. Right adnexal cystic mass measuring 5.4 x 4.1 x 4.7 cm. This is likely an ovarian cyst. No left adnexal masses. Other: No abdominal wall hernia or abnormality. No abdominopelvic ascites. Musculoskeletal: No acute or significant osseous findings. IMPRESSION: 1. 5.4 cm right adnexal/ovarian cystic mass. Given the patient's symptoms of right  lower quadrant abdominal pain, further evaluation of this finding with transabdominal and endovaginal pelvic ultrasound with Doppler analysis is recommended to exclude torsion and further characterize the lesion. 2. Normal appendix visualized. 3. Hepatic steatosis.  No other abnormalities. Electronically Signed   By: Amie Portland M.D.   On: 12/19/2020 08:57   US PELVIC COMPLETE W TRANSVAGINAL AND TORSION R/O  Result Date: 12/19/2020 CLINICAL DATA:  Right lower quadrant pain.  Follow-up CT EXAM: TRANSABDOMINAL AND TRANSVAGINAL ULTRASOUND OF PELVIS DOPPLER ULTRASOUND OF OVARIES TECHNIQUE: Both transabdominal and transvaginal ultrasound examinations of the pelvis were performed. Transabdominal technique was performed for global imaging of the pelvis including uterus, ovaries, adnexal regions, and pelvic cul-de-sac. It was necessary to proceed with endovaginal exam following the transabdominal exam to visualize the uterus, endometrium, ovaries and adnexa. Color and duplex Doppler ultrasound was utilized to evaluate blood flow to the ovaries. COMPARISON:  CT earlier today FINDINGS: Uterus Measurements: 8.1 x 4.6 x 4.5 cm = volume: 88 mL. No fibroids or other mass visualized. Endometrium Thickness:  18 mm in thickness.  No focal abnormality visualized. Right ovary Measurements: 5.2 x 5.2 x 4.4 cm = volume: 63 mL. 4 cm complex lesion in the right ovary with internal echoes. Appearance is most compatible with endometrioma or hemorrhagic cyst. Left ovary Measurements: 9 x 3.5 x 2.1 cm = volume: 19 mL. Normal appearance/no adnexal mass. Pulsed Doppler evaluation of both ovaries demonstrates normal low-resistance arterial and venous waveforms. Other findings No abnormal free fluid. IMPRESSION: Complex lesion in the right ovary measuring up to 4 cm most compatible with endometrioma or hemorrhagic cyst. No evidence of torsion. This could be followed with repeat ultrasound in 3-6 months. Electronically Signed   By: Charlett Nose M.D.   On: 12/19/2020 11:25    Procedures Procedures   Medications Ordered in ED Medications  ondansetron (ZOFRAN) injection 4 mg (4 mg Intravenous Given 12/19/20 0904)  morphine 4 MG/ML injection 4 mg (4 mg Intravenous Given 12/19/20 0904)  iohexol (OMNIPAQUE) 300 MG/ML solution 100 mL (100 mLs Intravenous Contrast Given 12/19/20 0818)    ED Course  I have reviewed the triage vital signs and the nursing notes.  Pertinent labs & imaging results that were available during my care of the patient were reviewed by me and considered in my medical decision making (see chart for details).    MDM Rules/Calculators/A&P                           Nil Whistler is a 35 y.o. female with a past medical history significant for PCOS who presents with right lower quadrant abdominal pain.  Patient reports that 3 days ago, patient began near her umbilicus and then has migrated to her right lower quadrant.  She reports it is 10 out of 10 now and she has some nausea no vomiting.  She reports no recent trauma and denies any vaginal discharge or bleeding.  She denies history of kidney stones but still has her appendix.  She reports no rashes to suggest shingles and reports the pain is not improving.  She reports some constipation but denies diarrhea.  No other fevers, chills, chest pain or shortness of breath, or cough.  On exam, right lower quadrant is tender to palpation.  Bowel sounds were appreciated.  Flank and back nontender.  Moving all extremities.  Lungs clear and chest nontender.  Upper back nontender.  Clinically I feel we need to rule out appendicitis given the migratory right lower quadrant abdominal pain.  We discussed how it could be kidney stones versus constipation versus diverticulitis or other etiologies however appendicitis needs to be ruled out first.  She reports this does not feel like PCOS symptoms she is had in the past.  We discussed that if her work-up is otherwise  completely reassuring and she is still having significant pain, we would likely also need to perform a pelvic exam and possible ultrasound to rule out ovarian etiology.  Patient agrees.  Will give pain medicine, nausea medicine, and make her n.p.o.  Anticipate reassessment after work-up.  10:27 AM CT returned showing no evidence of appendicitis.  There was a large 5 cm cyst on her right ovary and given the location of discomfort and the CT findings, radiology recommended ultrasound.  I offered patient pelvic ultrasound, swabs, and ultrasound but she does not want to get a pelvic exam today.  She is amenable to an ultrasound to rule out torsion however and she will follow-up with her OB/GYN  otherwise.  We will get the ultrasound and reassess.  2:02 PM Ultrasound showed likely hemorrhagic cyst causing the pain.  Otherwise no evidence of torsion.  Patient was reassessed and had some improvement in pain.  Patient will be appropriate for discharge with pain medicine and nausea medicine and follow-up with her OB/GYN.  She does report she is trying to get pregnant and we will let the OB/GYN help weigh in for further management without.  There are no questions or concerns and was discharged in good condition  Final Clinical Impression(s) / ED Diagnoses Final diagnoses:  RLQ abdominal pain  Cyst of right ovary    Rx / DC Orders ED Discharge Orders          Ordered    oxyCODONE-acetaminophen (PERCOCET/ROXICET) 5-325 MG tablet  Every 4 hours PRN        12/19/20 1404    ondansetron (ZOFRAN) 4 MG tablet  Every 8 hours PRN        12/19/20 1404           Clinical Impression: 1. RLQ abdominal pain   2. Right lower quadrant abdominal pain   3. Cyst of right ovary     Disposition: Discharge  Condition: Good  I have discussed the results, Dx and Tx plan with the pt(& family if present). He/she/they expressed understanding and agree(s) with the plan. Discharge instructions discussed at  great length. Strict return precautions discussed and pt &/or family have verbalized understanding of the instructions. No further questions at time of discharge.    New Prescriptions   ONDANSETRON (ZOFRAN) 4 MG TABLET    Take 1 tablet (4 mg total) by mouth every 8 (eight) hours as needed for nausea or vomiting.   OXYCODONE-ACETAMINOPHEN (PERCOCET/ROXICET) 5-325 MG TABLET    Take 1 tablet by mouth every 4 (four) hours as needed for severe pain.    Follow Up: Your OBGYN and a PCP        Magaly Pollina, Canary Brim, MD 12/19/20 [phone]

## 2020-12-19 NOTE — ED Triage Notes (Signed)
Pt c/o right sided abd pain, denies n/v.

## 2022-03-22 IMAGING — US US PELVIS COMPLETE TRANSABD/TRANSVAG W DUPLEX
1 series · 13 of 25 positions shown · non-contrast
Comparison: CT earlier today

CLINICAL DATA: Right lower quadrant pain.  Follow-up CT

EXAM:
TRANSABDOMINAL AND TRANSVAGINAL ULTRASOUND OF PELVIS
DOPPLER ULTRASOUND OF OVARIES
TECHNIQUE: Both transabdominal and transvaginal ultrasound examinations of the
pelvis were performed. Transabdominal technique was performed for
global imaging of the pelvis including uterus, ovaries, adnexal
regions, and pelvic cul-de-sac.
It was necessary to proceed with endovaginal exam following the
transabdominal exam to visualize the uterus, endometrium, ovaries
and adnexa. Color and duplex Doppler ultrasound was utilized to
evaluate blood flow to the ovaries.

[Series 1: us pelvis complete transabd/transvag w duplex · 13 of 121 slices shown]
[im 1/121]
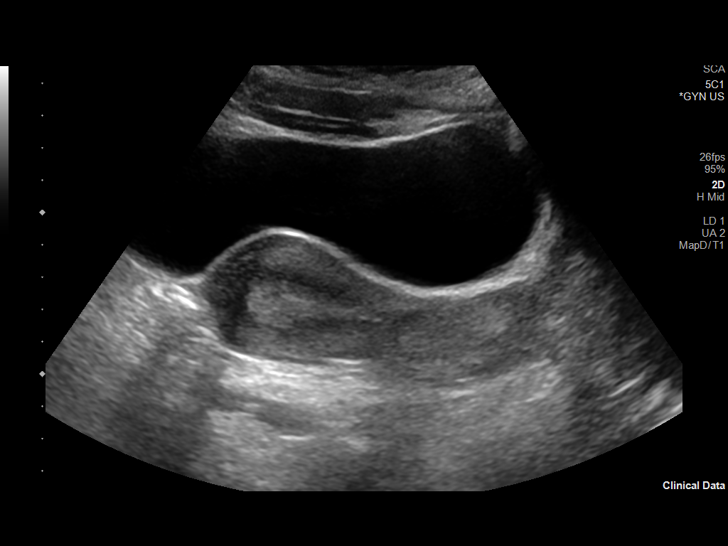
[im 11/121]
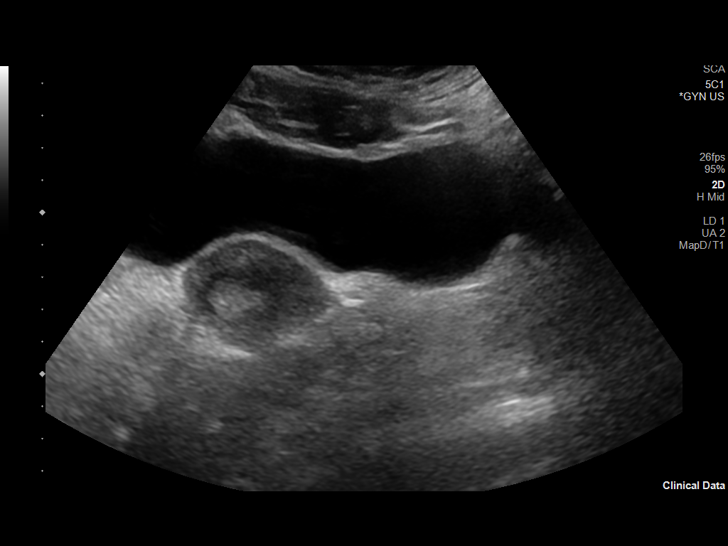
[im 21/121]
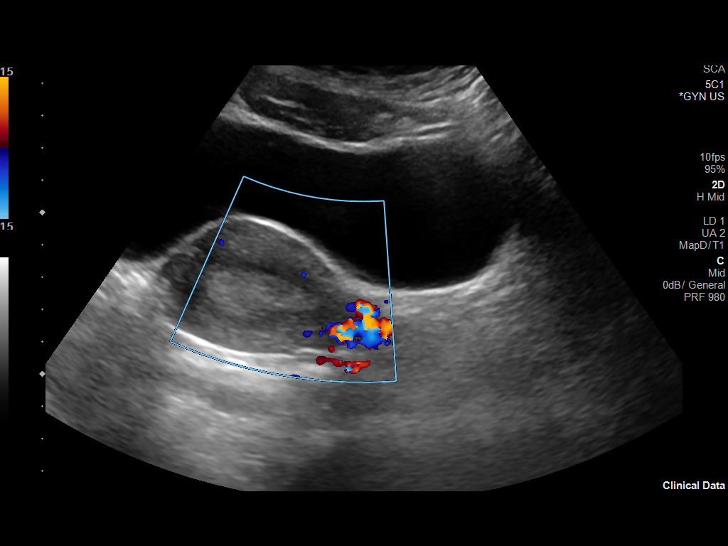
[im 31/121]
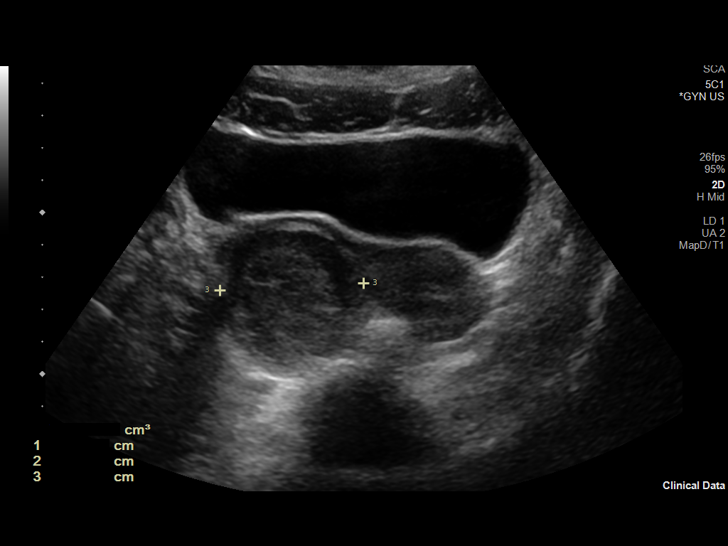
[im 41/121]
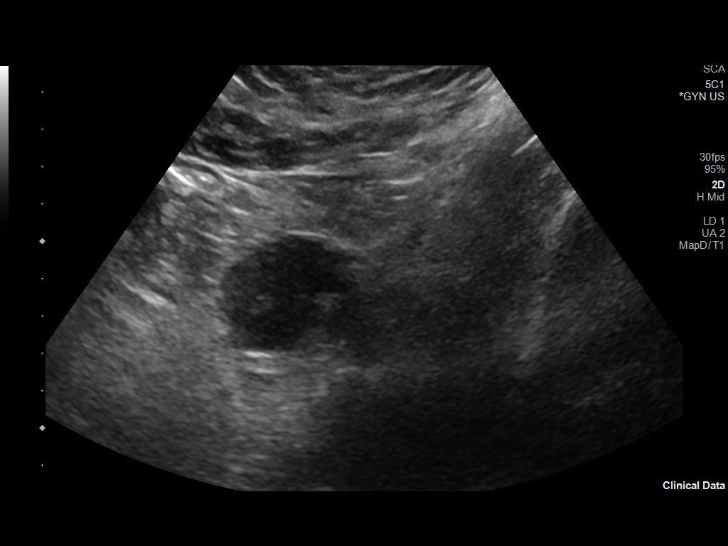
[im 51/121]
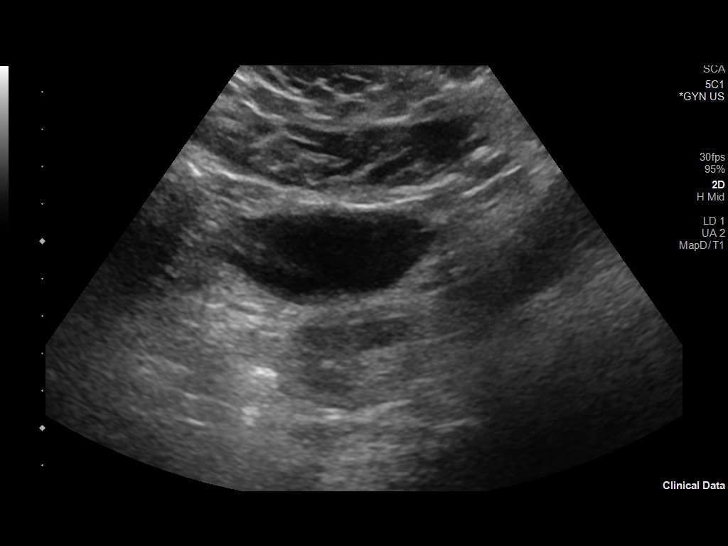
[im 61/121]
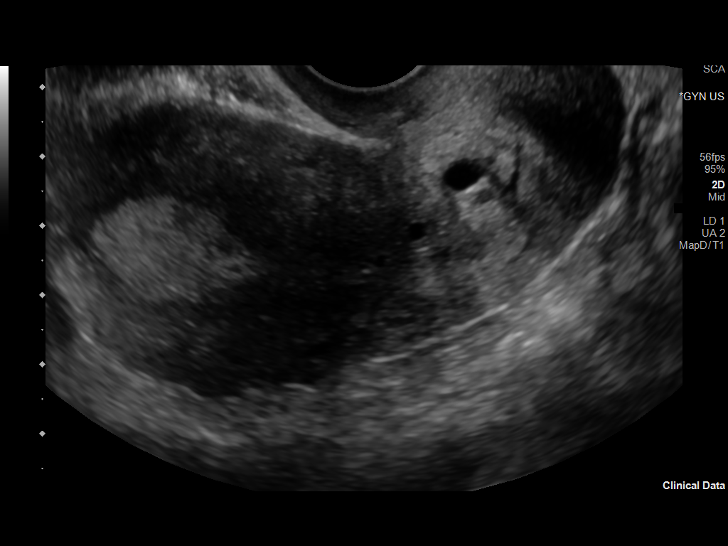
[im 71/121]
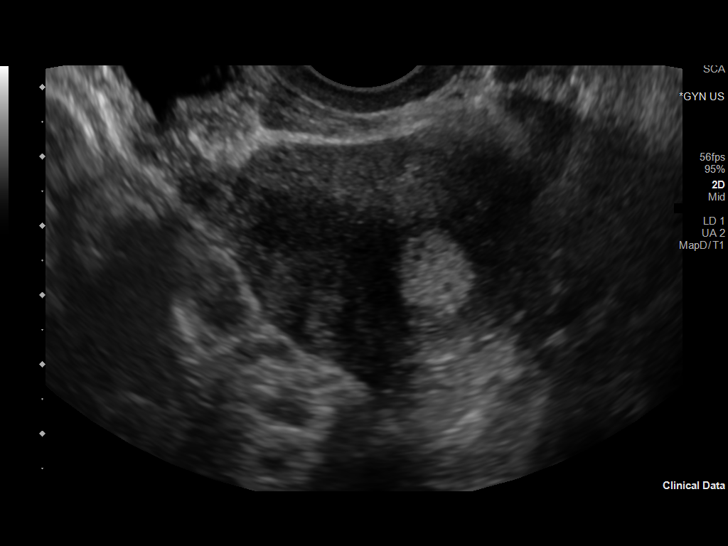
[im 81/121]
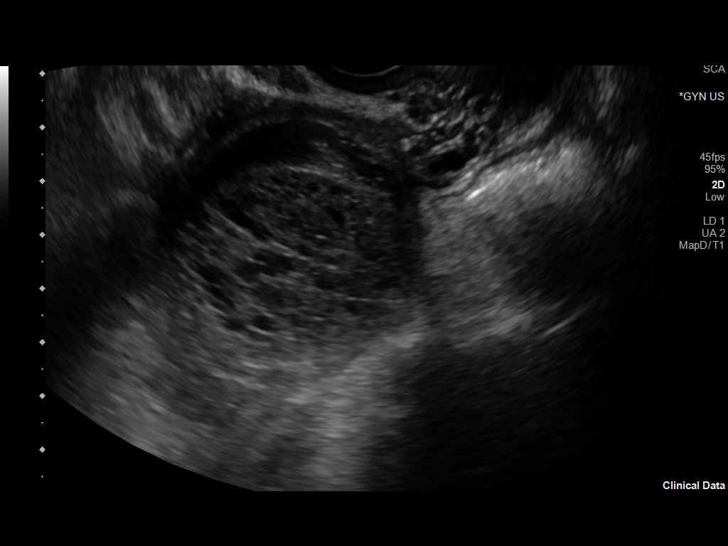
[im 91/121]
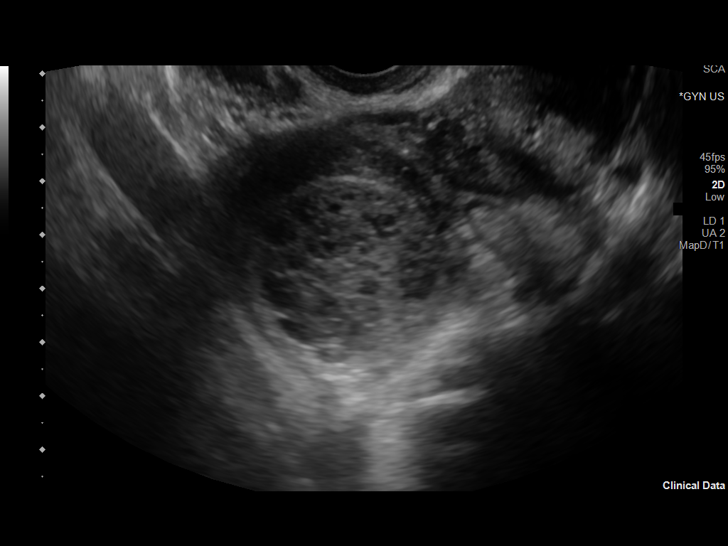
[im 101/121]
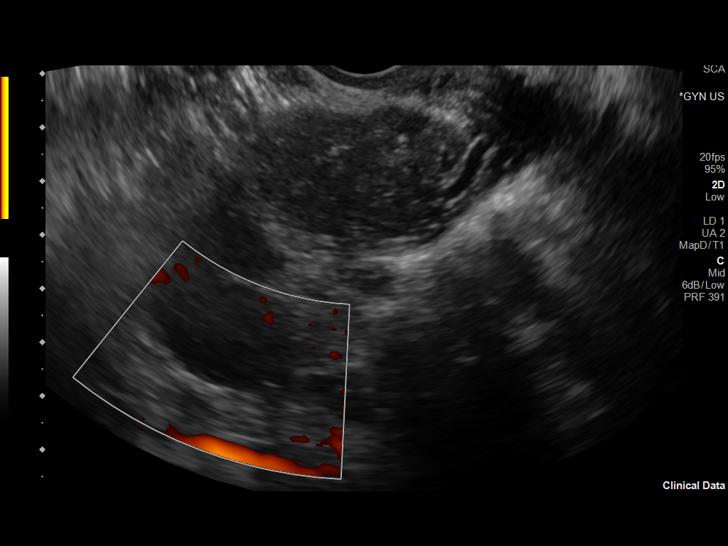
[im 111/121]
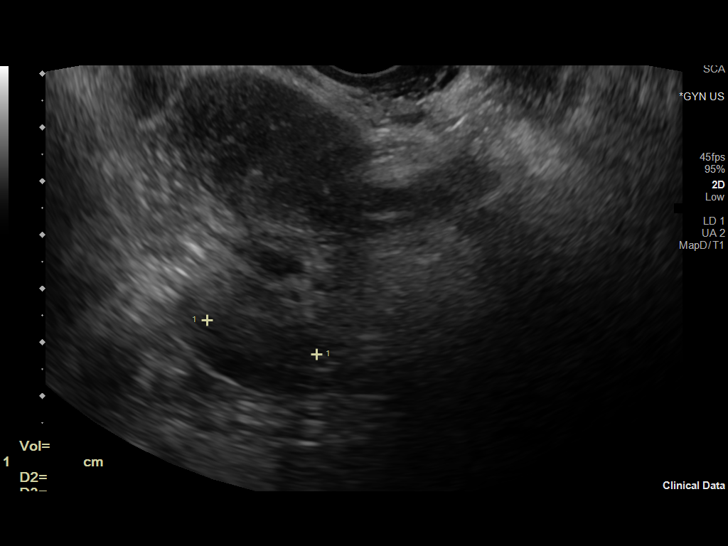
[im 121/121]
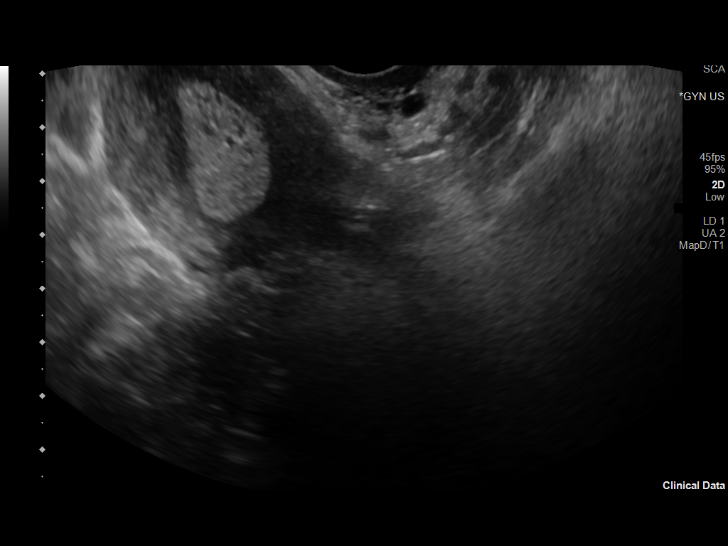

[13 of 25 positions shown; findings below may reference images not displayed]

FINDINGS: Uterus

Measurements: 8.1 x 4.6 x 4.5 cm = volume: 88 mL. No fibroids or
other mass visualized.

Endometrium

Thickness: 18 mm in thickness.  No focal abnormality visualized.

Right ovary

Measurements: 5.2 x 5.2 x 4.4 cm = volume: 63 mL. 4 cm complex
lesion in the right ovary with internal echoes. Appearance is most
compatible with endometrioma or hemorrhagic cyst.

Left ovary

Measurements: 9 x 3.5 x 2.1 cm = volume: 19 mL. Normal appearance/no
adnexal mass.

Pulsed Doppler evaluation of both ovaries demonstrates normal
low-resistance arterial and venous waveforms.

Other findings

No abnormal free fluid.
IMPRESSION: Complex lesion in the right ovary measuring up to 4 cm most
compatible with endometrioma or hemorrhagic cyst. No evidence of
torsion. This could be followed with repeat ultrasound in 3-6
months.

## 2022-03-22 IMAGING — CT CT ABD-PELV W/ CM
2 of 4 series · 16 of 46 positions shown, 18 images · IV contrast (Omnipaque)
Comparison: None.

CLINICAL DATA: Right lower quadrant abdominal pain. Clinical
concern for appendicitis.

EXAM:
CT ABDOMEN AND PELVIS WITH CONTRAST
TECHNIQUE: Multidetector CT imaging of the abdomen and pelvis was performed
using the standard protocol following bolus administration of
intravenous contrast.
CONTRAST:  100mL OMNIPAQUE IOHEXOL 300 MG/ML  SOLN

[Series 2: axial st · axial · 0.95mm/px · z∈[-428,+2]mm · 13 of 94 slices shown, 15 images]
[im 4/94  soft-tissue]
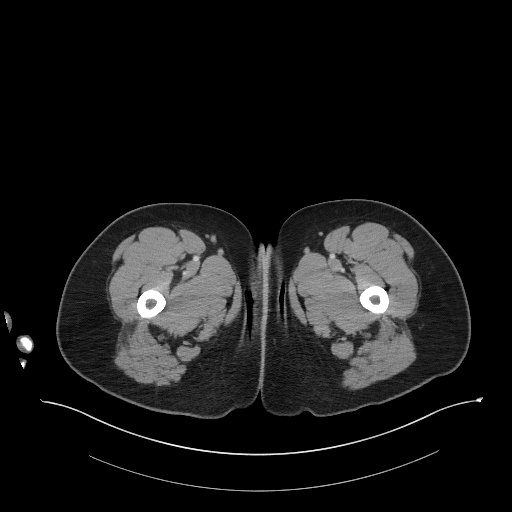
[im 4/94  bone]
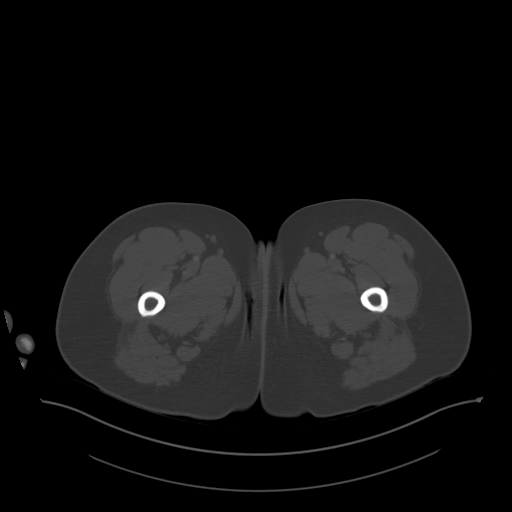
[im 12/94  soft-tissue]
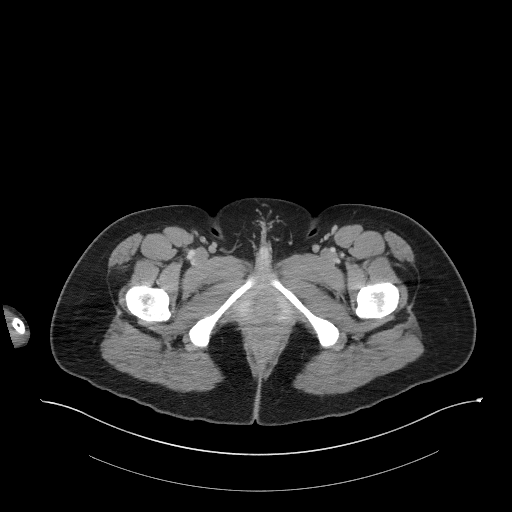
[im 20/94  soft-tissue]
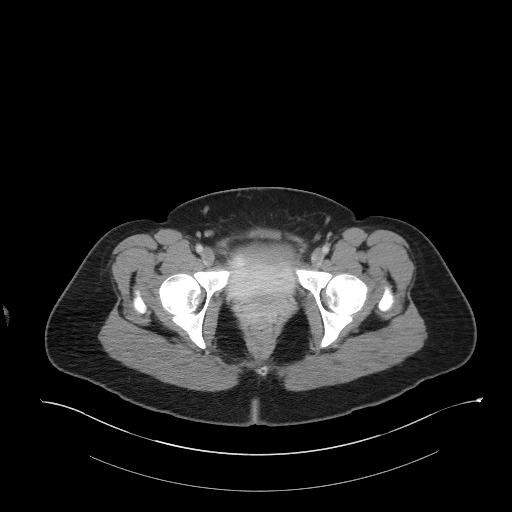
[im 28/94  soft-tissue]
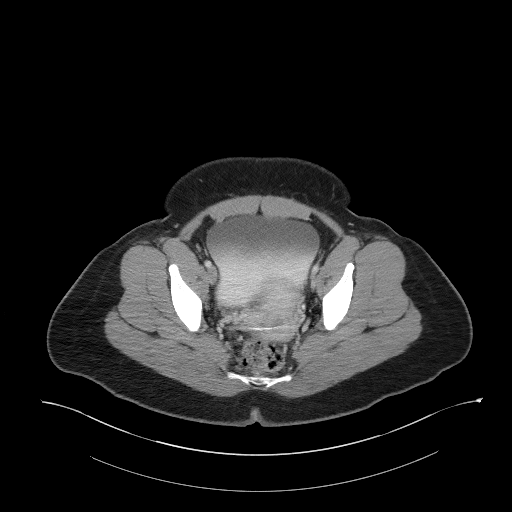
[im 32/94  soft-tissue]
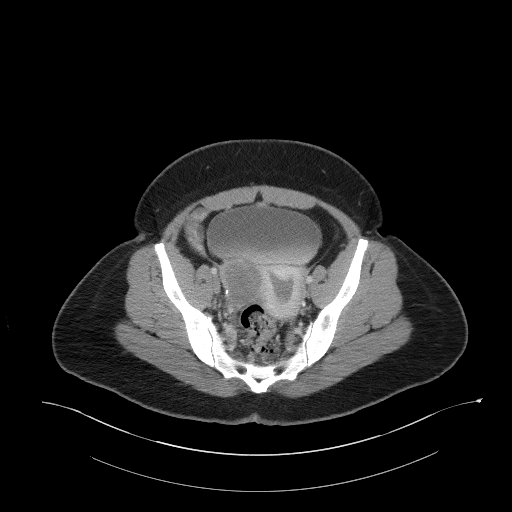
[im 39/94  soft-tissue]
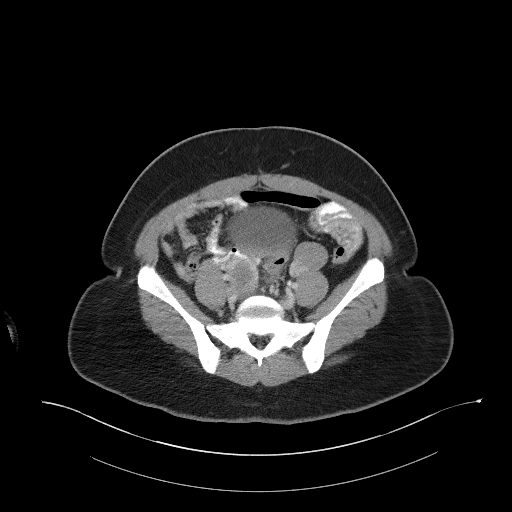
[im 47/94  soft-tissue]
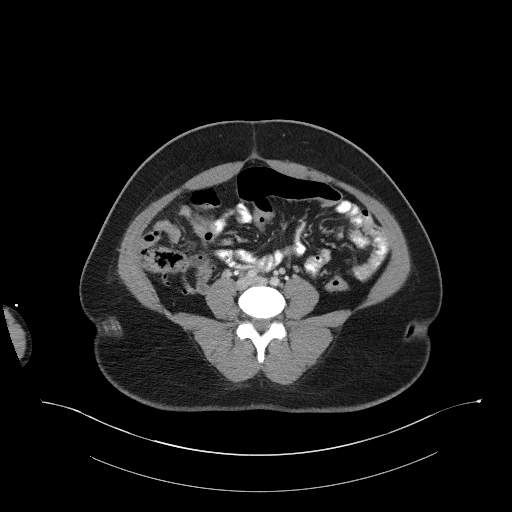
[im 55/94  soft-tissue]
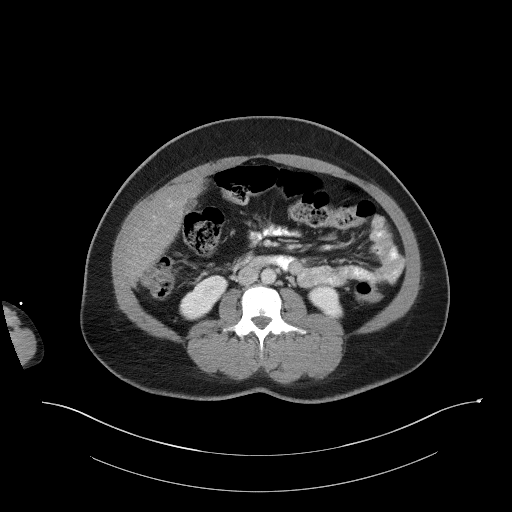
[im 63/94  soft-tissue]
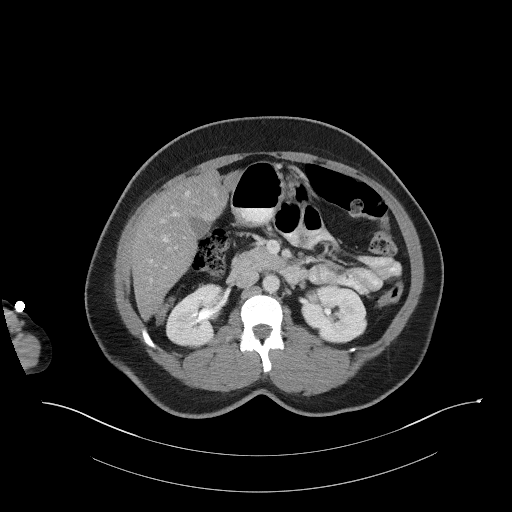
[im 63/94  bone]
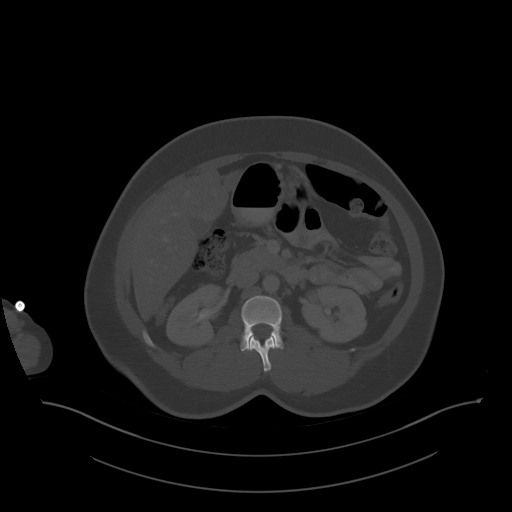
[im 66/94  soft-tissue]
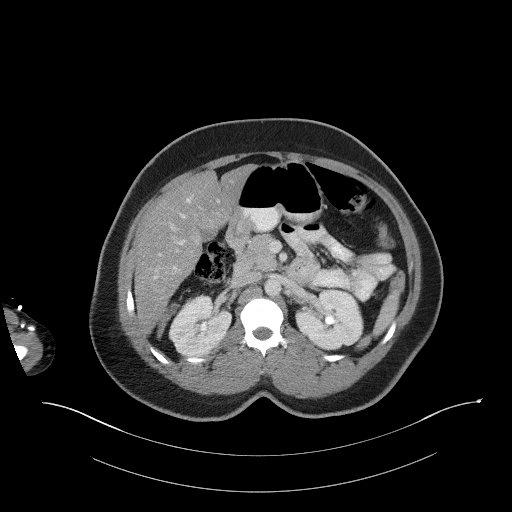
[im 74/94  soft-tissue]
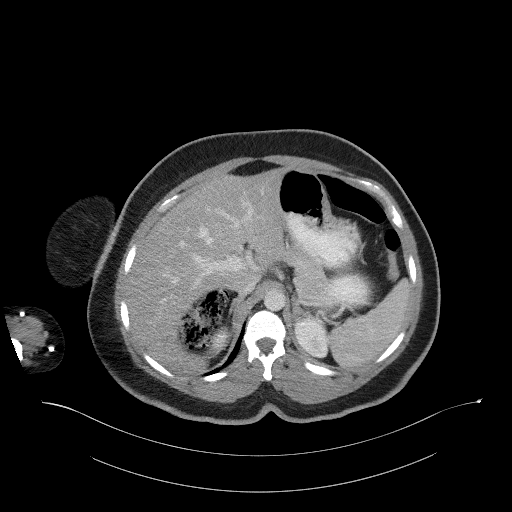
[im 82/94  soft-tissue]
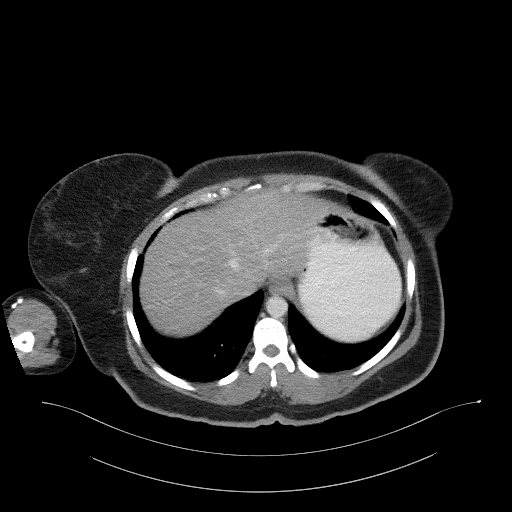
[im 90/94  soft-tissue]
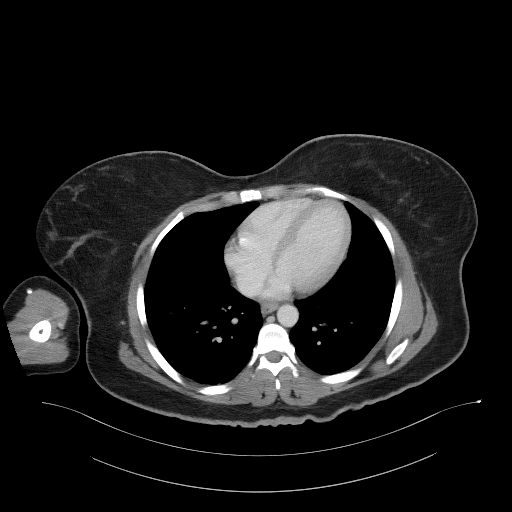

[Series 5: coronal st · coronal · 0.74mm/px · 3 of 99 slices shown]
[im 33/99  soft-tissue]
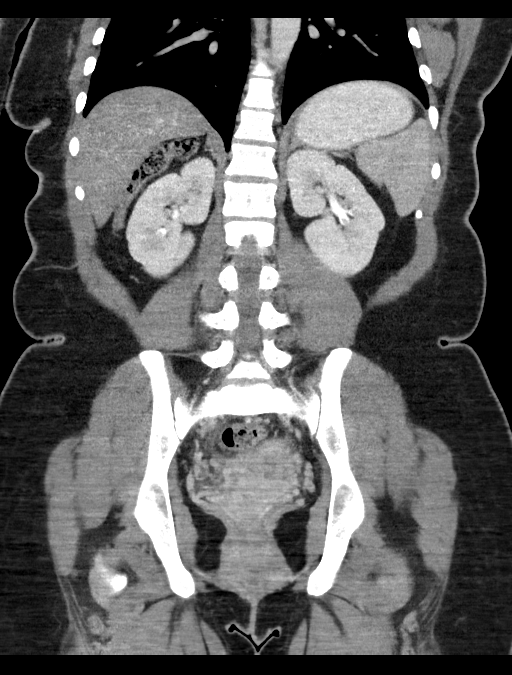
[im 44/99  soft-tissue]
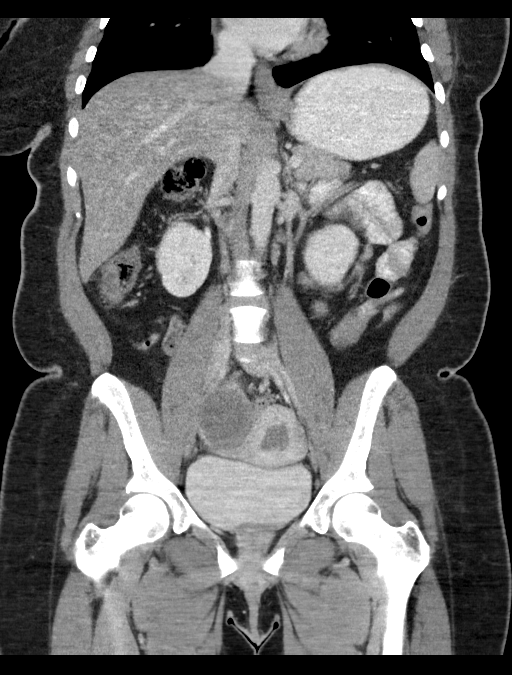
[im 55/99  soft-tissue]
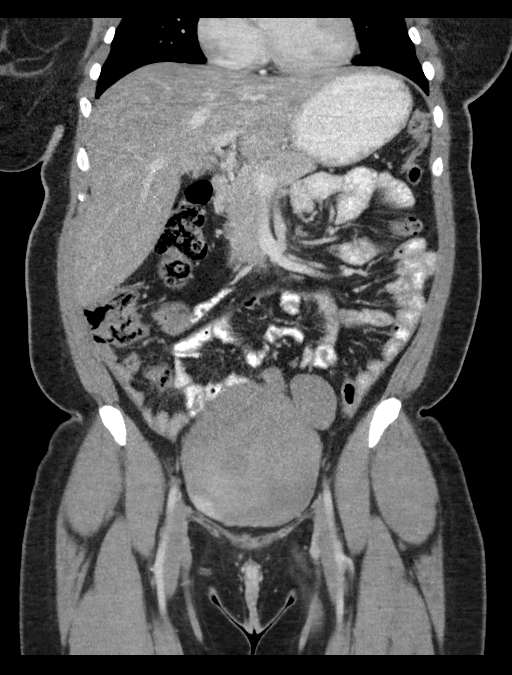

[16 of 46 positions shown; findings below may reference images not displayed]

FINDINGS: Lower chest: Clear lung bases.  Heart normal in size.

Hepatobiliary: Decreased attenuation of the liver consistent with
fatty infiltration. Liver normal in size. No masses. Normal
gallbladder. No bile duct dilation.

Pancreas: Unremarkable. No pancreatic ductal dilatation or
surrounding inflammatory changes.

Spleen: Normal in size without focal abnormality.

Adrenals/Urinary Tract: Adrenal glands are unremarkable. Kidneys are
normal, without renal calculi, focal lesion, or hydronephrosis.
Bladder is unremarkable.

Stomach/Bowel: Normal appendix visualized.

Normal stomach. Small bowel and colon are normal in caliber. No wall
thickening or inflammation.

Vascular/Lymphatic: No significant vascular findings are present. No
enlarged abdominal or pelvic lymph nodes.

Reproductive: Normal uterus. Right adnexal cystic mass measuring
x 4.1 x 4.7 cm. This is likely an ovarian cyst. No left adnexal
masses.

Other: No abdominal wall hernia or abnormality. No abdominopelvic
ascites.

Musculoskeletal: No acute or significant osseous findings.
IMPRESSION: 1. 5.4 cm right adnexal/ovarian cystic mass. Given the patient's
symptoms of right lower quadrant abdominal pain, further evaluation
of this finding with transabdominal and endovaginal pelvic
ultrasound with Doppler analysis is recommended to exclude torsion
and further characterize the lesion.
2. Normal appendix visualized.
3. Hepatic steatosis.  No other abnormalities.

## 2023-02-15 ENCOUNTER — Emergency Department (HOSPITAL_BASED_OUTPATIENT_CLINIC_OR_DEPARTMENT_OTHER): Payer: BC Managed Care – PPO

## 2023-02-15 ENCOUNTER — Other Ambulatory Visit: Payer: Self-pay

## 2023-02-15 ENCOUNTER — Encounter (HOSPITAL_BASED_OUTPATIENT_CLINIC_OR_DEPARTMENT_OTHER): Payer: Self-pay | Admitting: Emergency Medicine

## 2023-02-15 ENCOUNTER — Emergency Department (HOSPITAL_BASED_OUTPATIENT_CLINIC_OR_DEPARTMENT_OTHER)
Admission: EM | Admit: 2023-02-15 | Discharge: 2023-02-15 | Disposition: A | Discharge status: Home / Self Care | Payer: BC Managed Care – PPO | Attending: Emergency Medicine | Admitting: Emergency Medicine

## 2023-02-15 DIAGNOSIS — N83201 Unspecified ovarian cyst, right side: Secondary | ICD-10-CM

## 2023-02-15 DIAGNOSIS — R1031 Right lower quadrant pain: Secondary | ICD-10-CM

## 2023-02-15 LAB — COMPREHENSIVE METABOLIC PANEL
ALT: 28 U/L (ref 0–44)
AST: 28 U/L (ref 15–41)
Albumin: 4.3 g/dL (ref 3.5–5.0)
Alkaline Phosphatase: 55 U/L (ref 38–126)
Anion gap: 10 (ref 5–15)
BUN: 9 mg/dL (ref 6–20)
CO2: 26 mmol/L (ref 22–32)
Calcium: 9.3 mg/dL (ref 8.9–10.3)
Chloride: 103 mmol/L (ref 98–111)
Creatinine, Ser: 0.62 mg/dL (ref 0.44–1.00)
GFR, Estimated: >60 mL/min (ref >60)
Glucose, Bld: 143 mg/dL — ABNORMAL HIGH (ref 70–99)
Potassium: 3.6 mmol/L (ref 3.5–5.1)
Sodium: 139 mmol/L (ref 135–145)
Total Bilirubin: 0.5 mg/dL (ref <1.2)
Total Protein: 7.2 g/dL (ref 6.5–8.1)

## 2023-02-15 LAB — CBC
HCT: 38.3 % (ref 36.0–46.0)
Hemoglobin: 12.7 g/dL (ref 12.0–15.0)
MCH: 30.2 pg (ref 26.0–34.0)
MCHC: 33.2 g/dL (ref 30.0–36.0)
MCV: 91 fL (ref 80.0–100.0)
Platelets: 264 10*3/uL (ref 150–400)
RBC: 4.21 MIL/uL (ref 3.87–5.11)
RDW: 12.5 % (ref 11.5–15.5)
WBC: 11.6 10*3/uL — ABNORMAL HIGH (ref 4.0–10.5)
nRBC: 0 % (ref 0.0–0.2)

## 2023-02-15 LAB — URINALYSIS, MICROSCOPIC (REFLEX)

## 2023-02-15 LAB — URINALYSIS, ROUTINE W REFLEX MICROSCOPIC
Bilirubin Urine: NEGATIVE
Glucose, UA: NEGATIVE mg/dL
Ketones, ur: NEGATIVE mg/dL
Leukocytes,Ua: NEGATIVE
Nitrite: NEGATIVE
Protein, ur: NEGATIVE mg/dL
Specific Gravity, Urine: 1.025 (ref 1.005–1.030)
pH: 7 (ref 5.0–8.0)

## 2023-02-15 LAB — LIPASE, BLOOD: Lipase: 24 U/L (ref 11–51)

## 2023-02-15 LAB — PREGNANCY, URINE: Preg Test, Ur: NEGATIVE

## 2023-02-15 MED ORDER — MORPHINE SULFATE (PF) 4 MG/ML IV SOLN
4.0000 mg | Freq: Once | INTRAVENOUS | Status: AC
  Administered 2023-02-15: 4 mg via INTRAVENOUS
  Filled 2023-02-15: qty 1

## 2023-02-15 MED ORDER — ONDANSETRON 4 MG PO TBDP
4.0000 mg | ORAL_TABLET | Freq: Once | ORAL | Status: AC
  Administered 2023-02-15: 4 mg via ORAL
  Filled 2023-02-15: qty 1

## 2023-02-15 MED ORDER — SODIUM CHLORIDE 0.9 % IV BOLUS
1000.0000 mL | Freq: Once | INTRAVENOUS | Status: AC
  Administered 2023-02-15: 1000 mL via INTRAVENOUS

## 2023-02-15 MED ORDER — IOHEXOL 300 MG/ML  SOLN
100.0000 mL | Freq: Once | INTRAMUSCULAR | Status: AC | PRN
  Administered 2023-02-15: 100 mL via INTRAVENOUS

## 2023-02-15 MED ORDER — HYDROCODONE-ACETAMINOPHEN 5-325 MG PO TABS: 1.0000 | ORAL_TABLET | Freq: Four times a day (QID) | ORAL | 0 refills | Status: AC | PRN

## 2023-02-15 MED ORDER — ONDANSETRON 8 MG PO TBDP: ORAL_TABLET | ORAL | 0 refills | Status: AC

## 2023-02-15 MED ORDER — ONDANSETRON HCL 4 MG/2ML IJ SOLN
4.0000 mg | Freq: Once | INTRAMUSCULAR | Status: DC
  Filled 2023-02-15: qty 2

## 2023-02-15 NOTE — ED Triage Notes (Signed)
Right side abdominal pian radiates to rectum. With emesis. Has been constipated off and on for months.

## 2023-02-15 NOTE — Discharge Instructions (Signed)
Begin taking ibuprofen 600 mg every 6 hours as needed for pain, and hydrocodone as prescribed as needed for pain not relieved with ibuprofen.  Begin taking Zofran as prescribed as needed for nausea.  Follow-up with primary doctor if not improving in the next few days.

## 2023-02-15 NOTE — ED Provider Notes (Signed)
[place] [facility]Y DEPARTMENT AT [facility] Provider Note   CSN: [id number] Arrival date & time: 02/15/23  0010     History  Chief Complaint  Patient presents with   Abdominal Pain    Anetha Vader is a 37 y.o. female.  This patient is a 37 year old female presenting with a 1 day history of right-sided abdominal pain, nausea, and vomiting.  She denies any fevers or chills.  She states that the pain radiates into her rectum.  She denies diarrhea, but does report some constipation intermittently for the past several months.  No prior abdominal surgeries.  Pain is worse when she attempts to lie on her right side, but no alleviating factors.  The history is provided by the patient.       Home Medications Prior to Admission medications   Medication Sig Start Date End Date Taking? Authorizing Provider  ondansetron (ZOFRAN) 4 MG tablet Take 1 tablet (4 mg total) by mouth every 8 (eight) hours as needed for nausea or vomiting. 12/19/20   Tegeler, Canary Brim, MD  oxyCODONE-acetaminophen (PERCOCET/ROXICET) 5-325 MG tablet Take 1 tablet by mouth every 4 (four) hours as needed for severe pain. 12/19/20   Tegeler, Canary Brim, MD      Allergies    Patient has no known allergies.    Review of Systems   Review of Systems  All other systems reviewed and are negative.   Physical Exam Updated Vital Signs BP (!) 140/126   Pulse 66   Temp 98.5 F (36.9 C) (Oral)   Resp 19   Ht 5' (1.524 m)   Wt 66.2 kg   LMP 01/26/2023 (Exact Date)   SpO2 100%   BMI 28.51 kg/m  Physical Exam Vitals and nursing note reviewed.  Constitutional:      General: She is not in acute distress.    Appearance: She is well-developed. She is not diaphoretic.  HENT:     Head: Normocephalic and atraumatic.  Cardiovascular:     Rate and Rhythm: Normal rate and regular rhythm.     Heart sounds: No murmur heard.    No friction rub. No gallop.  Pulmonary:     Effort: Pulmonary  effort is normal. No respiratory distress.     Breath sounds: Normal breath sounds. No wheezing.  Abdominal:     General: Bowel sounds are normal. There is no distension.     Palpations: Abdomen is soft.     Tenderness: There is abdominal tenderness in the right lower quadrant and suprapubic area. There is no right CVA tenderness, left CVA tenderness, guarding or rebound.  Musculoskeletal:        General: Normal range of motion.     Cervical back: Normal range of motion and neck supple.  Skin:    General: Skin is warm and dry.  Neurological:     General: No focal deficit present.     Mental Status: She is alert and oriented to person, place, and time.     ED Results / Procedures / Treatments   Labs (all labs ordered are listed, but only abnormal results are displayed) Labs Reviewed  COMPREHENSIVE METABOLIC PANEL - Abnormal; Notable for the following components:      Result Value   Glucose, Bld 143 (*)    All other components within normal limits  CBC - Abnormal; Notable for the following components:   WBC 11.6 (*)    All other components within normal limits  URINALYSIS, ROUTINE W REFLEX MICROSCOPIC -  Abnormal; Notable for the following components:   APPearance HAZY (*)    Hgb urine dipstick TRACE (*)    All other components within normal limits  URINALYSIS, MICROSCOPIC (REFLEX) - Abnormal; Notable for the following components:   Bacteria, UA RARE (*)    All other components within normal limits  LIPASE, BLOOD  PREGNANCY, URINE    EKG None  Radiology No results found.  Procedures Procedures    Medications Ordered in ED Medications  ondansetron (ZOFRAN-ODT) disintegrating tablet 4 mg (4 mg Oral Given 02/15/23 0150)  sodium chloride 0.9 % bolus 1,000 mL (1,000 mLs Intravenous New Bag/Given 02/15/23 0349)  morphine (PF) 4 MG/ML injection 4 mg (4 mg Intravenous Given 02/15/23 0349)    ED Course/ Medical Decision Making/ A&P  Patient is a 37 year old female  presenting with abdominal pain as described in the HPI.  Patient arrives here with stable vital signs and is afebrile.  She has mild tenderness to the right lower quadrant, but exam otherwise unremarkable.  Laboratory studies obtained including CBC, CMP, lipase, urinalysis, and urine pregnancy test.  She has a mild leukocytosis with white count of 11.6, but laboratory studies are otherwise unremarkable.  Urinalysis is clear and pregnancy test is negative.  CT scan of the abdomen and pelvis obtained showing no evidence for appendicitis, but does show a 4 cm right-sided ovarian cyst.  I have spoken with Dr. Despina Hidden from GYN regarding the CT findings.  There was recommendations to consider ultrasound to rule out torsion, but we are both in agreement that this does not fit the clinical picture.    Patient is feeling better after receiving medications including morphine for pain and Zofran for nausea.  Patient will be discharged with pain medication, Zofran, and follow-up as needed.  Final Clinical Impression(s) / ED Diagnoses Final diagnoses:  None    Rx / DC Orders ED Discharge Orders     None         Geoffery Lyons, MD 02/15/23 [phone]
